# Patient Record
Sex: Female | Born: 1950 | Race: White | Hispanic: No
Health system: Southern US, Community
[De-identification: ages and names within clinical notes are randomized; demographics above are authoritative.]

---

## 2016-11-01 DIAGNOSIS — Z79899 Other long term (current) drug therapy: Secondary | ICD-10-CM | POA: Diagnosis not present

## 2016-11-01 DIAGNOSIS — I10 Essential (primary) hypertension: Secondary | ICD-10-CM | POA: Diagnosis not present

## 2016-11-01 DIAGNOSIS — M4722 Other spondylosis with radiculopathy, cervical region: Secondary | ICD-10-CM | POA: Diagnosis not present

## 2016-11-01 DIAGNOSIS — K219 Gastro-esophageal reflux disease without esophagitis: Secondary | ICD-10-CM | POA: Diagnosis not present

## 2016-11-01 DIAGNOSIS — Z720 Tobacco use: Secondary | ICD-10-CM | POA: Diagnosis not present

## 2016-11-16 DIAGNOSIS — M4712 Other spondylosis with myelopathy, cervical region: Secondary | ICD-10-CM | POA: Diagnosis not present

## 2016-11-16 DIAGNOSIS — Z681 Body mass index (BMI) 19 or less, adult: Secondary | ICD-10-CM | POA: Diagnosis not present

## 2016-11-16 DIAGNOSIS — M542 Cervicalgia: Secondary | ICD-10-CM | POA: Diagnosis not present

## 2016-11-16 DIAGNOSIS — I10 Essential (primary) hypertension: Secondary | ICD-10-CM | POA: Diagnosis not present

## 2016-12-04 DIAGNOSIS — Z1231 Encounter for screening mammogram for malignant neoplasm of breast: Secondary | ICD-10-CM | POA: Diagnosis not present

## 2016-12-04 DIAGNOSIS — Z9189 Other specified personal risk factors, not elsewhere classified: Secondary | ICD-10-CM | POA: Diagnosis not present

## 2016-12-04 DIAGNOSIS — Z720 Tobacco use: Secondary | ICD-10-CM | POA: Diagnosis not present

## 2016-12-04 DIAGNOSIS — I10 Essential (primary) hypertension: Secondary | ICD-10-CM | POA: Diagnosis not present

## 2016-12-04 DIAGNOSIS — K219 Gastro-esophageal reflux disease without esophagitis: Secondary | ICD-10-CM | POA: Diagnosis not present

## 2016-12-04 DIAGNOSIS — Z5320 Procedure and treatment not carried out because of patient's decision for unspecified reasons: Secondary | ICD-10-CM | POA: Diagnosis not present

## 2016-12-04 DIAGNOSIS — M4722 Other spondylosis with radiculopathy, cervical region: Secondary | ICD-10-CM | POA: Diagnosis not present

## 2016-12-07 DIAGNOSIS — M4802 Spinal stenosis, cervical region: Secondary | ICD-10-CM | POA: Diagnosis not present

## 2016-12-07 DIAGNOSIS — M542 Cervicalgia: Secondary | ICD-10-CM | POA: Diagnosis not present

## 2016-12-20 DIAGNOSIS — I10 Essential (primary) hypertension: Secondary | ICD-10-CM | POA: Diagnosis not present

## 2016-12-20 DIAGNOSIS — M4712 Other spondylosis with myelopathy, cervical region: Secondary | ICD-10-CM | POA: Diagnosis not present

## 2016-12-20 DIAGNOSIS — Z681 Body mass index (BMI) 19 or less, adult: Secondary | ICD-10-CM | POA: Diagnosis not present

## 2016-12-25 ENCOUNTER — Other Ambulatory Visit: Payer: Self-pay | Admitting: Neurosurgery

## 2016-12-25 DIAGNOSIS — M4712 Other spondylosis with myelopathy, cervical region: Secondary | ICD-10-CM

## 2016-12-27 ENCOUNTER — Ambulatory Visit
Admission: RE | Admit: 2016-12-27 | Discharge: 2016-12-27 | Disposition: A | Discharge status: Home / Self Care | Payer: PPO | Source: Ambulatory Visit | Attending: Neurosurgery | Admitting: Neurosurgery

## 2016-12-27 DIAGNOSIS — M4712 Other spondylosis with myelopathy, cervical region: Secondary | ICD-10-CM

## 2016-12-27 DIAGNOSIS — M4802 Spinal stenosis, cervical region: Secondary | ICD-10-CM | POA: Diagnosis not present

## 2016-12-28 DIAGNOSIS — M4012 Other secondary kyphosis, cervical region: Secondary | ICD-10-CM | POA: Diagnosis not present

## 2016-12-28 DIAGNOSIS — M4712 Other spondylosis with myelopathy, cervical region: Secondary | ICD-10-CM | POA: Diagnosis not present

## 2016-12-29 ENCOUNTER — Other Ambulatory Visit: Payer: Self-pay | Admitting: Neurosurgery

## 2017-03-19 ENCOUNTER — Inpatient Hospital Stay (HOSPITAL_COMMUNITY): Admission: RE | Admit: 2017-03-19 | Payer: PPO | Source: Ambulatory Visit

## 2017-03-26 DIAGNOSIS — Z681 Body mass index (BMI) 19 or less, adult: Secondary | ICD-10-CM | POA: Diagnosis not present

## 2017-03-26 DIAGNOSIS — I10 Essential (primary) hypertension: Secondary | ICD-10-CM | POA: Diagnosis not present

## 2017-03-26 DIAGNOSIS — Z720 Tobacco use: Secondary | ICD-10-CM | POA: Diagnosis not present

## 2017-03-26 DIAGNOSIS — F4323 Adjustment disorder with mixed anxiety and depressed mood: Secondary | ICD-10-CM | POA: Diagnosis not present

## 2017-03-26 DIAGNOSIS — Z79899 Other long term (current) drug therapy: Secondary | ICD-10-CM | POA: Diagnosis not present

## 2017-03-26 DIAGNOSIS — Z1159 Encounter for screening for other viral diseases: Secondary | ICD-10-CM | POA: Diagnosis not present

## 2017-03-26 DIAGNOSIS — Z5320 Procedure and treatment not carried out because of patient's decision for unspecified reasons: Secondary | ICD-10-CM | POA: Diagnosis not present

## 2017-03-26 DIAGNOSIS — M4722 Other spondylosis with radiculopathy, cervical region: Secondary | ICD-10-CM | POA: Diagnosis not present

## 2017-03-26 DIAGNOSIS — Z0000 Encounter for general adult medical examination without abnormal findings: Secondary | ICD-10-CM | POA: Diagnosis not present

## 2017-03-26 DIAGNOSIS — K219 Gastro-esophageal reflux disease without esophagitis: Secondary | ICD-10-CM | POA: Diagnosis not present

## 2017-04-23 ENCOUNTER — Inpatient Hospital Stay (HOSPITAL_COMMUNITY): Admission: RE | Admit: 2017-04-23 | Payer: PPO | Source: Ambulatory Visit

## 2017-05-01 ENCOUNTER — Encounter (HOSPITAL_COMMUNITY): Admission: RE | Payer: Self-pay | Source: Ambulatory Visit

## 2017-05-01 ENCOUNTER — Inpatient Hospital Stay (HOSPITAL_COMMUNITY): Admission: RE | Admit: 2017-05-01 | Payer: PPO | Source: Ambulatory Visit | Admitting: Neurosurgery

## 2017-05-01 SURGERY — POSTERIOR CERVICAL FUSION/FORAMINOTOMY LEVEL 3
Anesthesia: General

## 2017-05-04 ENCOUNTER — Inpatient Hospital Stay (HOSPITAL_COMMUNITY): Admission: RE | Admit: 2017-05-04 | Payer: PPO | Source: Ambulatory Visit | Admitting: Neurosurgery

## 2017-05-04 ENCOUNTER — Encounter (HOSPITAL_COMMUNITY): Admission: RE | Payer: Self-pay | Source: Ambulatory Visit

## 2017-05-04 SURGERY — ANTERIOR AND POSTERIOR SPINAL FUSION
Anesthesia: General

## 2017-10-17 DIAGNOSIS — J019 Acute sinusitis, unspecified: Secondary | ICD-10-CM | POA: Diagnosis not present

## 2017-10-17 DIAGNOSIS — B9689 Other specified bacterial agents as the cause of diseases classified elsewhere: Secondary | ICD-10-CM | POA: Diagnosis not present

## 2017-10-17 DIAGNOSIS — Z720 Tobacco use: Secondary | ICD-10-CM | POA: Diagnosis not present

## 2017-10-17 DIAGNOSIS — Z681 Body mass index (BMI) 19 or less, adult: Secondary | ICD-10-CM | POA: Diagnosis not present

## 2017-10-17 DIAGNOSIS — J069 Acute upper respiratory infection, unspecified: Secondary | ICD-10-CM | POA: Diagnosis not present

## 2017-11-27 DIAGNOSIS — E559 Vitamin D deficiency, unspecified: Secondary | ICD-10-CM | POA: Diagnosis not present

## 2017-11-27 DIAGNOSIS — M4722 Other spondylosis with radiculopathy, cervical region: Secondary | ICD-10-CM | POA: Diagnosis not present

## 2017-11-27 DIAGNOSIS — K219 Gastro-esophageal reflux disease without esophagitis: Secondary | ICD-10-CM | POA: Diagnosis not present

## 2017-11-27 DIAGNOSIS — Z79899 Other long term (current) drug therapy: Secondary | ICD-10-CM | POA: Diagnosis not present

## 2017-11-27 DIAGNOSIS — Z6820 Body mass index (BMI) 20.0-20.9, adult: Secondary | ICD-10-CM | POA: Diagnosis not present

## 2017-11-27 DIAGNOSIS — I10 Essential (primary) hypertension: Secondary | ICD-10-CM | POA: Diagnosis not present

## 2017-11-27 DIAGNOSIS — F17210 Nicotine dependence, cigarettes, uncomplicated: Secondary | ICD-10-CM | POA: Diagnosis not present

## 2018-01-25 IMAGING — CT CT CERVICAL SPINE W/O CM
3 of 7 series · 13 of 34 positions shown, 15 images · non-contrast
Comparison: Cervical spine radiographs at ordering provider office
11/16/2016.

CLINICAL DATA: Neck pain and popping. Osteoarthritis. No history of
previous surgery.

EXAM:
CT CERVICAL SPINE WITHOUT CONTRAST
TECHNIQUE: Multidetector CT imaging of the cervical spine was performed without
intravenous contrast. Multiplanar CT image reconstructions were also
generated.

[Series 200: cor · coronal · 0.39mm/px · 1 of 57 slices shown]
[im 29/57  bone]
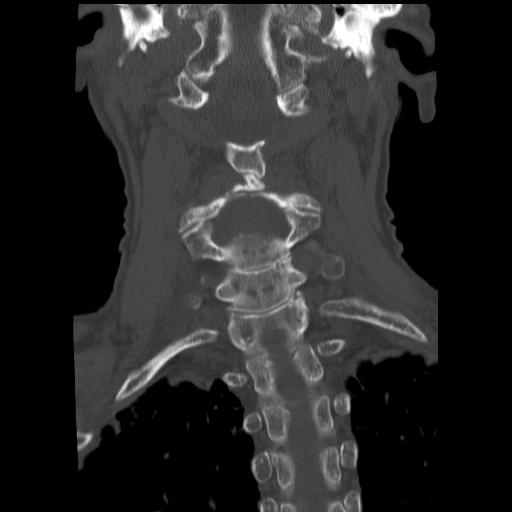

[Series 202: sag · sagittal · 0.39mm/px · 5 of 57 slices shown]
[im 10/57  bone]
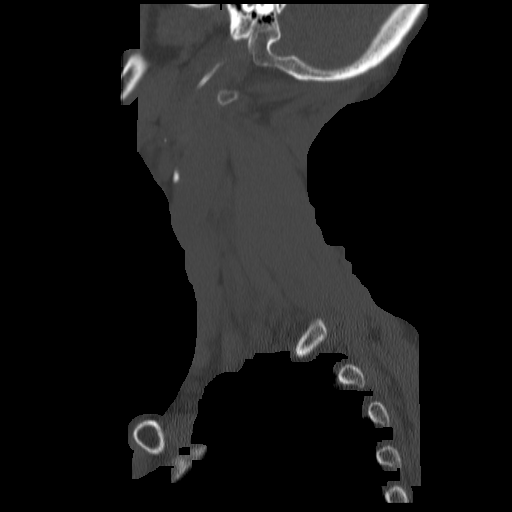
[im 19/57  bone]
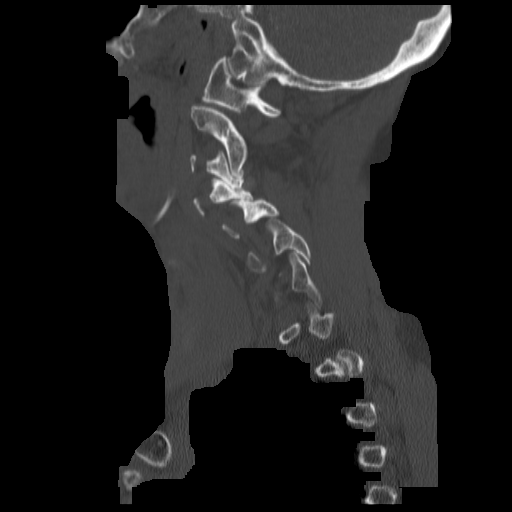
[im 29/57  bone]
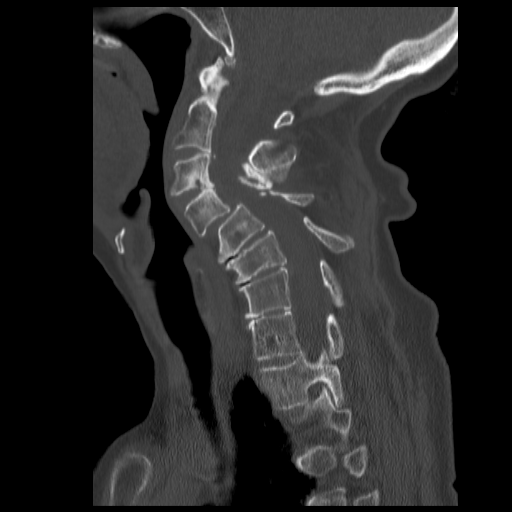
[im 38/57  bone]
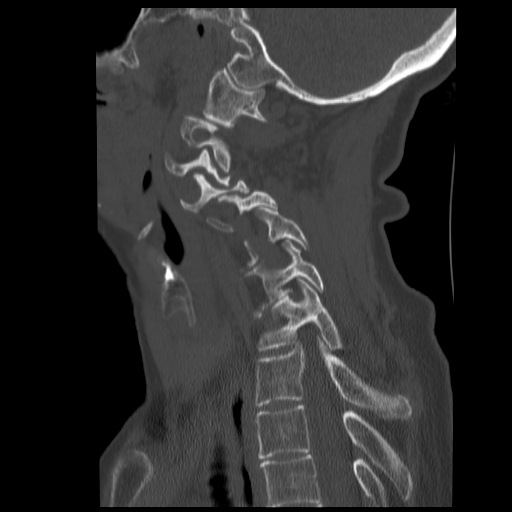
[im 47/57  bone]
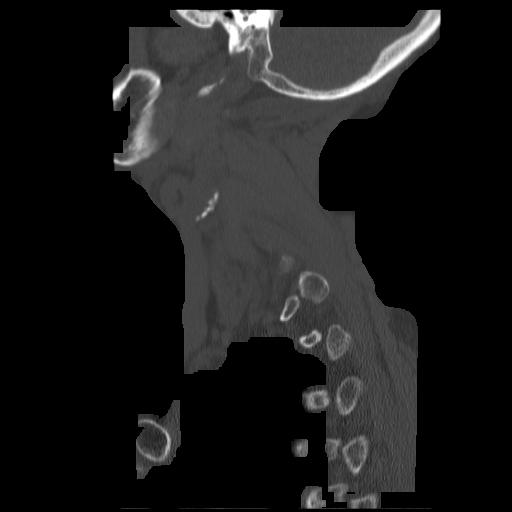

[Series 204: angled axial · axial · 0.20mm/px · z∈[-43,+107]mm · 7 of 287 slices shown, 9 images]
[im 36/287  soft-tissue]
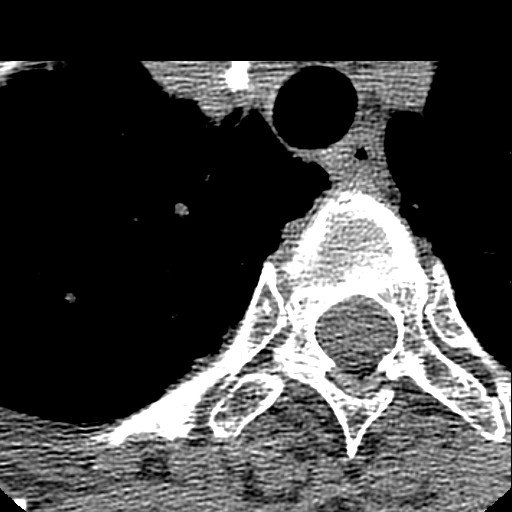
[im 36/287  bone]
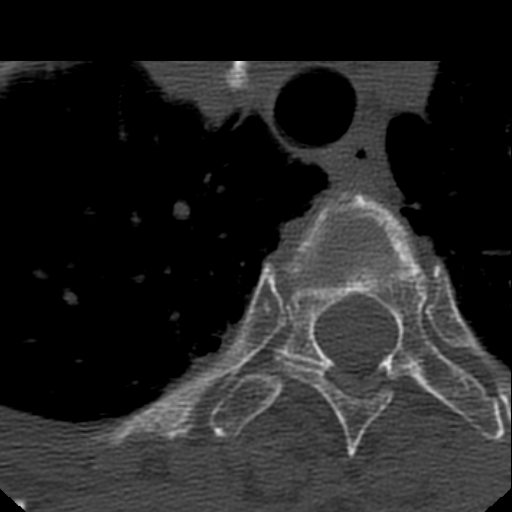
[im 72/287  bone]
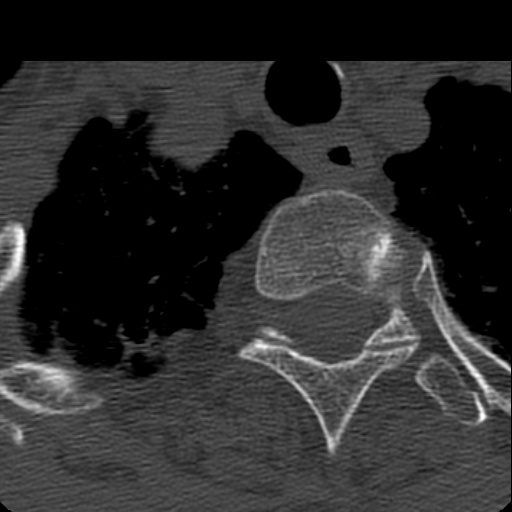
[im 108/287  bone]
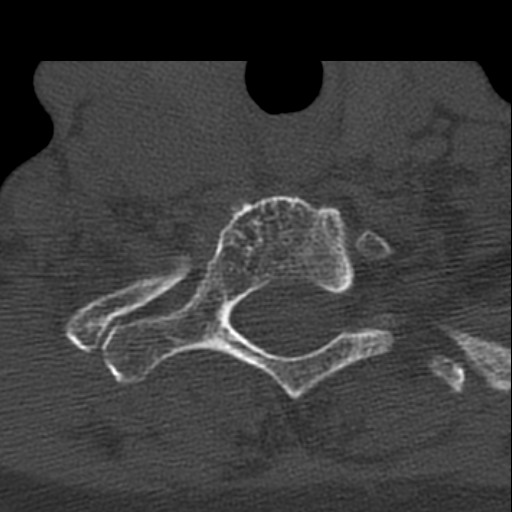
[im 144/287  bone]
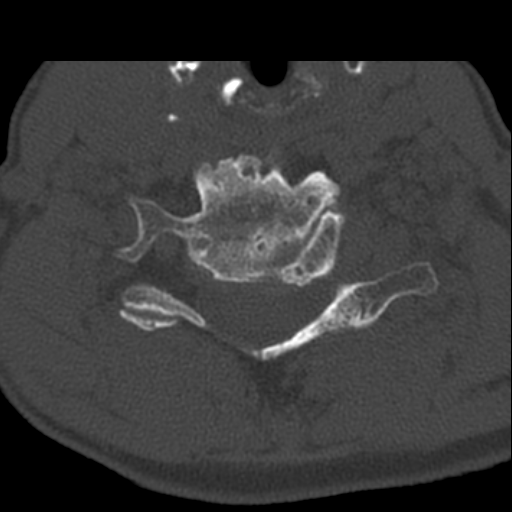
[im 179/287  soft-tissue]
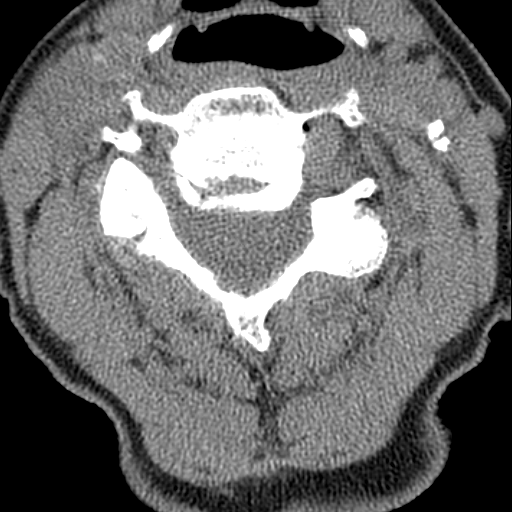
[im 179/287  bone]
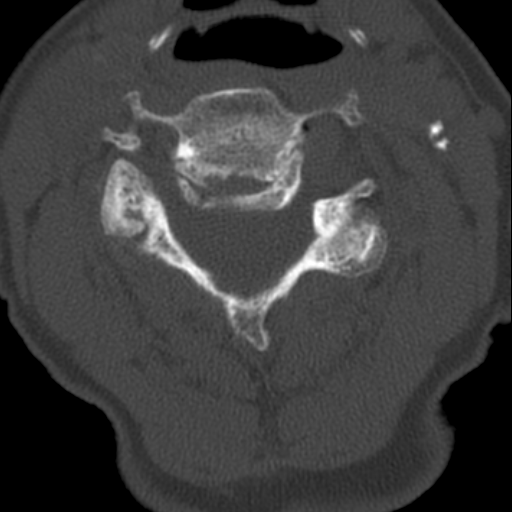
[im 215/287  bone]
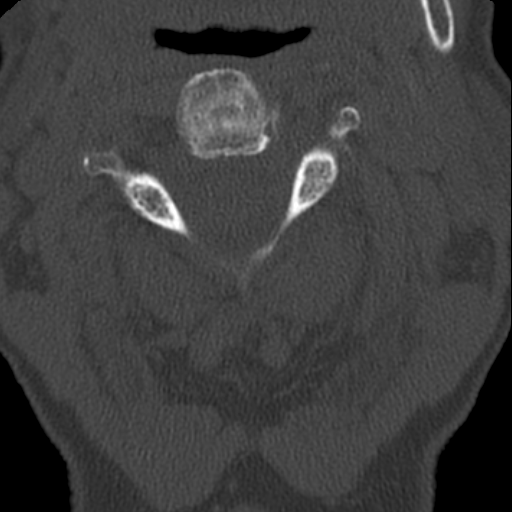
[im 251/287  bone]
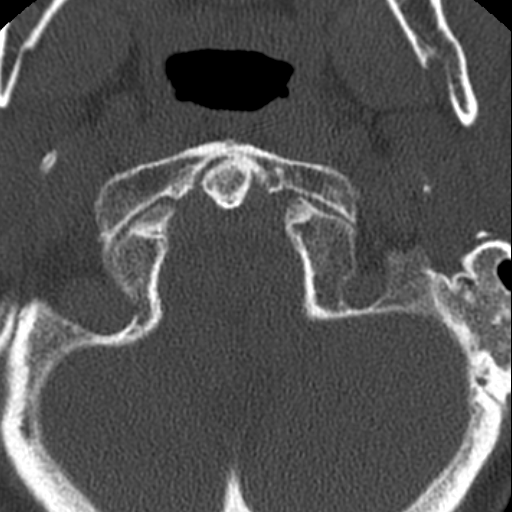

[13 of 34 positions shown; findings below may reference images not displayed]

FINDINGS: Alignment: 5 mm anterolisthesis C4 on C5. Yolanda deformity caudal
to this level, with disc space narrowing at C5-6, C6-7, and C7-T1.

Skull base and vertebrae: Incidental hemangioma in T1. Endplate
sclerotic change above and below C3-C4. Advanced facet arthropathy.

Soft tissues and spinal canal: No prevertebral fluid or swelling. No
visible canal hematoma. Atherosclerosis.

Disc levels:

C2-3: LEFT greater than RIGHT facet arthropathy. Slight LEFT-sided
uncinate spurring without impingement.

C3-4: Severe LEFT-sided facet arthropathy. LEFT greater than RIGHT
uncinate spurring contributes to LEFT C4 foraminal narrowing.

C4-5: 5 mm of facet mediated slip. Mild canal stenosis is suspected.
LEFT C5 foraminal narrowing is likely.

C5-6:  Advanced disc space narrowing.  No definite impingement.

C6-7: Advanced disc space narrowing. Central protrusion. LEFT-sided
uncinate spurring. LEFT C7 foraminal narrowing.

C7-T1: Advanced disc space narrowing. Facet arthropathy. LEFT-sided
uncinate spurring. LEFT C8 foraminal narrowing.

Upper chest: Fibrotic change at the lung apices. Scattered
emphysematous blebs.

Other: None.
IMPRESSION: Advanced cervical spondylosis. Potentially symptomatic neural
impingement at C3-4, C4-5, C6-7, and/or C7-T1. See discussion above.

5 mm slip at C4-5 contributes to mild stenosis. This is facet
mediated. Severe facet arthropathy is also present on the LEFT at
C3-C4.

## 2018-09-07 DIAGNOSIS — M4722 Other spondylosis with radiculopathy, cervical region: Secondary | ICD-10-CM | POA: Diagnosis not present

## 2018-09-07 DIAGNOSIS — K219 Gastro-esophageal reflux disease without esophagitis: Secondary | ICD-10-CM | POA: Diagnosis not present

## 2018-09-07 DIAGNOSIS — I10 Essential (primary) hypertension: Secondary | ICD-10-CM | POA: Diagnosis not present

## 2018-09-12 DIAGNOSIS — Z79899 Other long term (current) drug therapy: Secondary | ICD-10-CM | POA: Diagnosis not present

## 2018-09-12 DIAGNOSIS — E559 Vitamin D deficiency, unspecified: Secondary | ICD-10-CM | POA: Diagnosis not present

## 2018-10-03 DIAGNOSIS — M545 Low back pain: Secondary | ICD-10-CM | POA: Diagnosis not present

## 2018-10-03 DIAGNOSIS — J209 Acute bronchitis, unspecified: Secondary | ICD-10-CM | POA: Diagnosis not present

## 2018-10-03 DIAGNOSIS — B9789 Other viral agents as the cause of diseases classified elsewhere: Secondary | ICD-10-CM | POA: Diagnosis not present

## 2018-10-03 DIAGNOSIS — J069 Acute upper respiratory infection, unspecified: Secondary | ICD-10-CM | POA: Diagnosis not present

## 2018-10-03 DIAGNOSIS — Z6820 Body mass index (BMI) 20.0-20.9, adult: Secondary | ICD-10-CM | POA: Diagnosis not present

## 2018-10-03 DIAGNOSIS — I10 Essential (primary) hypertension: Secondary | ICD-10-CM | POA: Diagnosis not present

## 2018-10-08 DIAGNOSIS — E559 Vitamin D deficiency, unspecified: Secondary | ICD-10-CM | POA: Diagnosis not present

## 2018-10-08 DIAGNOSIS — M4722 Other spondylosis with radiculopathy, cervical region: Secondary | ICD-10-CM | POA: Diagnosis not present

## 2018-10-08 DIAGNOSIS — K219 Gastro-esophageal reflux disease without esophagitis: Secondary | ICD-10-CM | POA: Diagnosis not present

## 2018-10-17 DIAGNOSIS — K219 Gastro-esophageal reflux disease without esophagitis: Secondary | ICD-10-CM | POA: Diagnosis not present

## 2018-10-17 DIAGNOSIS — Z9181 History of falling: Secondary | ICD-10-CM | POA: Diagnosis not present

## 2018-10-17 DIAGNOSIS — Z1331 Encounter for screening for depression: Secondary | ICD-10-CM | POA: Diagnosis not present

## 2018-10-17 DIAGNOSIS — Z6820 Body mass index (BMI) 20.0-20.9, adult: Secondary | ICD-10-CM | POA: Diagnosis not present

## 2018-10-17 DIAGNOSIS — M4722 Other spondylosis with radiculopathy, cervical region: Secondary | ICD-10-CM | POA: Diagnosis not present

## 2018-10-17 DIAGNOSIS — I10 Essential (primary) hypertension: Secondary | ICD-10-CM | POA: Diagnosis not present

## 2018-10-17 DIAGNOSIS — F17210 Nicotine dependence, cigarettes, uncomplicated: Secondary | ICD-10-CM | POA: Diagnosis not present

## 2018-11-07 DIAGNOSIS — I10 Essential (primary) hypertension: Secondary | ICD-10-CM | POA: Diagnosis not present

## 2018-11-07 DIAGNOSIS — E559 Vitamin D deficiency, unspecified: Secondary | ICD-10-CM | POA: Diagnosis not present

## 2018-11-07 DIAGNOSIS — K219 Gastro-esophageal reflux disease without esophagitis: Secondary | ICD-10-CM | POA: Diagnosis not present

## 2018-11-13 DIAGNOSIS — E559 Vitamin D deficiency, unspecified: Secondary | ICD-10-CM | POA: Diagnosis not present

## 2018-11-13 DIAGNOSIS — Z79899 Other long term (current) drug therapy: Secondary | ICD-10-CM | POA: Diagnosis not present

## 2018-11-13 DIAGNOSIS — K219 Gastro-esophageal reflux disease without esophagitis: Secondary | ICD-10-CM | POA: Diagnosis not present

## 2018-12-06 DIAGNOSIS — E559 Vitamin D deficiency, unspecified: Secondary | ICD-10-CM | POA: Diagnosis not present

## 2018-12-06 DIAGNOSIS — I10 Essential (primary) hypertension: Secondary | ICD-10-CM | POA: Diagnosis not present

## 2018-12-06 DIAGNOSIS — K219 Gastro-esophageal reflux disease without esophagitis: Secondary | ICD-10-CM | POA: Diagnosis not present

## 2019-01-07 DIAGNOSIS — K219 Gastro-esophageal reflux disease without esophagitis: Secondary | ICD-10-CM | POA: Diagnosis not present

## 2019-01-07 DIAGNOSIS — I10 Essential (primary) hypertension: Secondary | ICD-10-CM | POA: Diagnosis not present

## 2019-04-08 DIAGNOSIS — I10 Essential (primary) hypertension: Secondary | ICD-10-CM | POA: Diagnosis not present

## 2019-04-08 DIAGNOSIS — K219 Gastro-esophageal reflux disease without esophagitis: Secondary | ICD-10-CM | POA: Diagnosis not present

## 2019-05-09 DIAGNOSIS — K219 Gastro-esophageal reflux disease without esophagitis: Secondary | ICD-10-CM | POA: Diagnosis not present

## 2019-05-09 DIAGNOSIS — I10 Essential (primary) hypertension: Secondary | ICD-10-CM | POA: Diagnosis not present

## 2019-06-09 DIAGNOSIS — K219 Gastro-esophageal reflux disease without esophagitis: Secondary | ICD-10-CM | POA: Diagnosis not present

## 2019-06-09 DIAGNOSIS — I10 Essential (primary) hypertension: Secondary | ICD-10-CM | POA: Diagnosis not present

## 2019-06-12 DIAGNOSIS — F17210 Nicotine dependence, cigarettes, uncomplicated: Secondary | ICD-10-CM | POA: Diagnosis not present

## 2019-06-12 DIAGNOSIS — Z0000 Encounter for general adult medical examination without abnormal findings: Secondary | ICD-10-CM | POA: Diagnosis not present

## 2019-06-12 DIAGNOSIS — I10 Essential (primary) hypertension: Secondary | ICD-10-CM | POA: Diagnosis not present

## 2019-06-12 DIAGNOSIS — K219 Gastro-esophageal reflux disease without esophagitis: Secondary | ICD-10-CM | POA: Diagnosis not present

## 2019-06-12 DIAGNOSIS — Z6820 Body mass index (BMI) 20.0-20.9, adult: Secondary | ICD-10-CM | POA: Diagnosis not present

## 2019-06-12 DIAGNOSIS — M4722 Other spondylosis with radiculopathy, cervical region: Secondary | ICD-10-CM | POA: Diagnosis not present

## 2019-07-09 DIAGNOSIS — I10 Essential (primary) hypertension: Secondary | ICD-10-CM | POA: Diagnosis not present

## 2019-07-09 DIAGNOSIS — K219 Gastro-esophageal reflux disease without esophagitis: Secondary | ICD-10-CM | POA: Diagnosis not present

## 2019-08-08 DIAGNOSIS — K219 Gastro-esophageal reflux disease without esophagitis: Secondary | ICD-10-CM | POA: Diagnosis not present

## 2019-08-08 DIAGNOSIS — I10 Essential (primary) hypertension: Secondary | ICD-10-CM | POA: Diagnosis not present

## 2019-10-09 DIAGNOSIS — K219 Gastro-esophageal reflux disease without esophagitis: Secondary | ICD-10-CM | POA: Diagnosis not present

## 2019-10-09 DIAGNOSIS — I10 Essential (primary) hypertension: Secondary | ICD-10-CM | POA: Diagnosis not present

## 2019-12-07 DIAGNOSIS — I10 Essential (primary) hypertension: Secondary | ICD-10-CM | POA: Diagnosis not present

## 2019-12-07 DIAGNOSIS — K219 Gastro-esophageal reflux disease without esophagitis: Secondary | ICD-10-CM | POA: Diagnosis not present

## 2020-02-02 DIAGNOSIS — M4712 Other spondylosis with myelopathy, cervical region: Secondary | ICD-10-CM | POA: Diagnosis not present

## 2020-02-11 DIAGNOSIS — M5126 Other intervertebral disc displacement, lumbar region: Secondary | ICD-10-CM | POA: Diagnosis not present

## 2020-02-11 DIAGNOSIS — M545 Low back pain: Secondary | ICD-10-CM | POA: Diagnosis not present

## 2020-02-11 DIAGNOSIS — M4712 Other spondylosis with myelopathy, cervical region: Secondary | ICD-10-CM | POA: Diagnosis not present

## 2020-02-11 DIAGNOSIS — M4802 Spinal stenosis, cervical region: Secondary | ICD-10-CM | POA: Diagnosis not present

## 2020-02-11 DIAGNOSIS — M48061 Spinal stenosis, lumbar region without neurogenic claudication: Secondary | ICD-10-CM | POA: Diagnosis not present

## 2020-02-11 DIAGNOSIS — M47816 Spondylosis without myelopathy or radiculopathy, lumbar region: Secondary | ICD-10-CM | POA: Diagnosis not present

## 2020-02-11 DIAGNOSIS — M50223 Other cervical disc displacement at C6-C7 level: Secondary | ICD-10-CM | POA: Diagnosis not present

## 2020-02-16 DIAGNOSIS — Z6820 Body mass index (BMI) 20.0-20.9, adult: Secondary | ICD-10-CM | POA: Diagnosis not present

## 2020-02-16 DIAGNOSIS — H6123 Impacted cerumen, bilateral: Secondary | ICD-10-CM | POA: Diagnosis not present

## 2020-02-16 DIAGNOSIS — J309 Allergic rhinitis, unspecified: Secondary | ICD-10-CM | POA: Diagnosis not present

## 2020-02-17 DIAGNOSIS — H61303 Acquired stenosis of external ear canal, unspecified, bilateral: Secondary | ICD-10-CM | POA: Diagnosis not present

## 2020-02-17 DIAGNOSIS — H6123 Impacted cerumen, bilateral: Secondary | ICD-10-CM | POA: Diagnosis not present

## 2020-02-17 DIAGNOSIS — T7840XA Allergy, unspecified, initial encounter: Secondary | ICD-10-CM | POA: Diagnosis not present

## 2020-02-17 DIAGNOSIS — H9190 Unspecified hearing loss, unspecified ear: Secondary | ICD-10-CM | POA: Diagnosis not present

## 2020-02-19 ENCOUNTER — Other Ambulatory Visit: Payer: Self-pay | Admitting: Physician Assistant

## 2020-02-19 DIAGNOSIS — M4712 Other spondylosis with myelopathy, cervical region: Secondary | ICD-10-CM

## 2020-02-23 ENCOUNTER — Ambulatory Visit
Admission: RE | Admit: 2020-02-23 | Discharge: 2020-02-23 | Disposition: A | Discharge status: Home / Self Care | Payer: PPO | Source: Ambulatory Visit | Attending: Physician Assistant | Admitting: Physician Assistant

## 2020-02-23 DIAGNOSIS — M4712 Other spondylosis with myelopathy, cervical region: Secondary | ICD-10-CM

## 2020-02-23 DIAGNOSIS — M4802 Spinal stenosis, cervical region: Secondary | ICD-10-CM | POA: Diagnosis not present

## 2020-03-08 DIAGNOSIS — I10 Essential (primary) hypertension: Secondary | ICD-10-CM | POA: Diagnosis not present

## 2020-03-08 DIAGNOSIS — K219 Gastro-esophageal reflux disease without esophagitis: Secondary | ICD-10-CM | POA: Diagnosis not present

## 2020-03-10 DIAGNOSIS — M4712 Other spondylosis with myelopathy, cervical region: Secondary | ICD-10-CM | POA: Diagnosis not present

## 2020-03-10 DIAGNOSIS — M4012 Other secondary kyphosis, cervical region: Secondary | ICD-10-CM | POA: Diagnosis not present

## 2020-03-10 DIAGNOSIS — I10 Essential (primary) hypertension: Secondary | ICD-10-CM | POA: Diagnosis not present

## 2020-03-10 DIAGNOSIS — M542 Cervicalgia: Secondary | ICD-10-CM | POA: Diagnosis not present

## 2020-03-17 DIAGNOSIS — I10 Essential (primary) hypertension: Secondary | ICD-10-CM | POA: Diagnosis not present

## 2020-03-17 DIAGNOSIS — M4712 Other spondylosis with myelopathy, cervical region: Secondary | ICD-10-CM | POA: Diagnosis not present

## 2020-03-17 DIAGNOSIS — M4012 Other secondary kyphosis, cervical region: Secondary | ICD-10-CM | POA: Diagnosis not present

## 2020-03-26 DIAGNOSIS — K219 Gastro-esophageal reflux disease without esophagitis: Secondary | ICD-10-CM | POA: Diagnosis not present

## 2020-03-26 DIAGNOSIS — Z6820 Body mass index (BMI) 20.0-20.9, adult: Secondary | ICD-10-CM | POA: Diagnosis not present

## 2020-03-26 DIAGNOSIS — F17210 Nicotine dependence, cigarettes, uncomplicated: Secondary | ICD-10-CM | POA: Diagnosis not present

## 2020-03-26 DIAGNOSIS — I10 Essential (primary) hypertension: Secondary | ICD-10-CM | POA: Diagnosis not present

## 2020-03-26 DIAGNOSIS — Z9181 History of falling: Secondary | ICD-10-CM | POA: Diagnosis not present

## 2020-03-26 DIAGNOSIS — M4722 Other spondylosis with radiculopathy, cervical region: Secondary | ICD-10-CM | POA: Diagnosis not present

## 2020-03-26 DIAGNOSIS — R0989 Other specified symptoms and signs involving the circulatory and respiratory systems: Secondary | ICD-10-CM | POA: Diagnosis not present

## 2020-03-26 DIAGNOSIS — Z1331 Encounter for screening for depression: Secondary | ICD-10-CM | POA: Diagnosis not present

## 2020-05-05 DIAGNOSIS — M438X2 Other specified deforming dorsopathies, cervical region: Secondary | ICD-10-CM | POA: Diagnosis not present

## 2020-05-05 DIAGNOSIS — M4312 Spondylolisthesis, cervical region: Secondary | ICD-10-CM | POA: Diagnosis not present

## 2020-05-05 DIAGNOSIS — M4712 Other spondylosis with myelopathy, cervical region: Secondary | ICD-10-CM | POA: Diagnosis not present

## 2020-05-05 DIAGNOSIS — M4802 Spinal stenosis, cervical region: Secondary | ICD-10-CM | POA: Diagnosis not present

## 2020-05-05 DIAGNOSIS — M810 Age-related osteoporosis without current pathological fracture: Secondary | ICD-10-CM | POA: Diagnosis not present

## 2020-05-05 DIAGNOSIS — F17210 Nicotine dependence, cigarettes, uncomplicated: Secondary | ICD-10-CM | POA: Diagnosis not present

## 2020-05-05 DIAGNOSIS — M5441 Lumbago with sciatica, right side: Secondary | ICD-10-CM | POA: Diagnosis not present

## 2020-05-05 DIAGNOSIS — M953 Acquired deformity of neck: Secondary | ICD-10-CM | POA: Diagnosis not present

## 2020-05-05 DIAGNOSIS — M5442 Lumbago with sciatica, left side: Secondary | ICD-10-CM | POA: Diagnosis not present

## 2020-05-05 DIAGNOSIS — G9529 Other cord compression: Secondary | ICD-10-CM | POA: Diagnosis not present

## 2020-05-05 DIAGNOSIS — M4012 Other secondary kyphosis, cervical region: Secondary | ICD-10-CM | POA: Diagnosis not present

## 2020-05-05 DIAGNOSIS — M4125 Other idiopathic scoliosis, thoracolumbar region: Secondary | ICD-10-CM | POA: Diagnosis not present

## 2020-05-05 DIAGNOSIS — M47812 Spondylosis without myelopathy or radiculopathy, cervical region: Secondary | ICD-10-CM | POA: Diagnosis not present

## 2020-05-08 DIAGNOSIS — I10 Essential (primary) hypertension: Secondary | ICD-10-CM | POA: Diagnosis not present

## 2020-05-08 DIAGNOSIS — K219 Gastro-esophageal reflux disease without esophagitis: Secondary | ICD-10-CM | POA: Diagnosis not present

## 2020-06-03 DIAGNOSIS — G8929 Other chronic pain: Secondary | ICD-10-CM | POA: Diagnosis not present

## 2020-06-03 DIAGNOSIS — M7918 Myalgia, other site: Secondary | ICD-10-CM | POA: Diagnosis not present

## 2020-06-03 DIAGNOSIS — M47812 Spondylosis without myelopathy or radiculopathy, cervical region: Secondary | ICD-10-CM | POA: Diagnosis not present

## 2020-06-03 DIAGNOSIS — M47816 Spondylosis without myelopathy or radiculopathy, lumbar region: Secondary | ICD-10-CM | POA: Diagnosis not present

## 2020-06-03 DIAGNOSIS — M438X2 Other specified deforming dorsopathies, cervical region: Secondary | ICD-10-CM | POA: Diagnosis not present

## 2020-06-03 DIAGNOSIS — M5442 Lumbago with sciatica, left side: Secondary | ICD-10-CM | POA: Diagnosis not present

## 2020-06-08 DIAGNOSIS — K219 Gastro-esophageal reflux disease without esophagitis: Secondary | ICD-10-CM | POA: Diagnosis not present

## 2020-06-08 DIAGNOSIS — I10 Essential (primary) hypertension: Secondary | ICD-10-CM | POA: Diagnosis not present

## 2020-06-24 DIAGNOSIS — N3000 Acute cystitis without hematuria: Secondary | ICD-10-CM | POA: Diagnosis not present

## 2020-06-24 DIAGNOSIS — Z6820 Body mass index (BMI) 20.0-20.9, adult: Secondary | ICD-10-CM | POA: Diagnosis not present

## 2020-06-24 DIAGNOSIS — I10 Essential (primary) hypertension: Secondary | ICD-10-CM | POA: Diagnosis not present

## 2020-07-08 DIAGNOSIS — M47812 Spondylosis without myelopathy or radiculopathy, cervical region: Secondary | ICD-10-CM | POA: Diagnosis not present

## 2020-07-08 DIAGNOSIS — M542 Cervicalgia: Secondary | ICD-10-CM | POA: Diagnosis not present

## 2020-07-08 DIAGNOSIS — M545 Low back pain: Secondary | ICD-10-CM | POA: Diagnosis not present

## 2020-07-08 DIAGNOSIS — M5442 Lumbago with sciatica, left side: Secondary | ICD-10-CM | POA: Diagnosis not present

## 2020-07-08 DIAGNOSIS — G8929 Other chronic pain: Secondary | ICD-10-CM | POA: Diagnosis not present

## 2020-07-08 DIAGNOSIS — M5441 Lumbago with sciatica, right side: Secondary | ICD-10-CM | POA: Diagnosis not present

## 2020-07-08 DIAGNOSIS — M438X2 Other specified deforming dorsopathies, cervical region: Secondary | ICD-10-CM | POA: Diagnosis not present

## 2020-07-13 DIAGNOSIS — G8929 Other chronic pain: Secondary | ICD-10-CM | POA: Diagnosis not present

## 2020-07-13 DIAGNOSIS — M542 Cervicalgia: Secondary | ICD-10-CM | POA: Diagnosis not present

## 2020-07-13 DIAGNOSIS — M545 Low back pain, unspecified: Secondary | ICD-10-CM | POA: Diagnosis not present

## 2020-07-13 DIAGNOSIS — M47812 Spondylosis without myelopathy or radiculopathy, cervical region: Secondary | ICD-10-CM | POA: Diagnosis not present

## 2020-07-13 DIAGNOSIS — M5442 Lumbago with sciatica, left side: Secondary | ICD-10-CM | POA: Diagnosis not present

## 2020-07-13 DIAGNOSIS — M438X2 Other specified deforming dorsopathies, cervical region: Secondary | ICD-10-CM | POA: Diagnosis not present

## 2020-07-13 DIAGNOSIS — M5441 Lumbago with sciatica, right side: Secondary | ICD-10-CM | POA: Diagnosis not present

## 2020-07-15 DIAGNOSIS — G8929 Other chronic pain: Secondary | ICD-10-CM | POA: Diagnosis not present

## 2020-07-15 DIAGNOSIS — M545 Low back pain, unspecified: Secondary | ICD-10-CM | POA: Diagnosis not present

## 2020-07-15 DIAGNOSIS — M438X2 Other specified deforming dorsopathies, cervical region: Secondary | ICD-10-CM | POA: Diagnosis not present

## 2020-07-15 DIAGNOSIS — M5442 Lumbago with sciatica, left side: Secondary | ICD-10-CM | POA: Diagnosis not present

## 2020-07-15 DIAGNOSIS — M542 Cervicalgia: Secondary | ICD-10-CM | POA: Diagnosis not present

## 2020-07-15 DIAGNOSIS — M47812 Spondylosis without myelopathy or radiculopathy, cervical region: Secondary | ICD-10-CM | POA: Diagnosis not present

## 2020-07-15 DIAGNOSIS — M5441 Lumbago with sciatica, right side: Secondary | ICD-10-CM | POA: Diagnosis not present

## 2020-07-19 DIAGNOSIS — M47812 Spondylosis without myelopathy or radiculopathy, cervical region: Secondary | ICD-10-CM | POA: Diagnosis not present

## 2020-07-19 DIAGNOSIS — M438X2 Other specified deforming dorsopathies, cervical region: Secondary | ICD-10-CM | POA: Diagnosis not present

## 2020-07-19 DIAGNOSIS — M545 Low back pain, unspecified: Secondary | ICD-10-CM | POA: Diagnosis not present

## 2020-07-19 DIAGNOSIS — M5441 Lumbago with sciatica, right side: Secondary | ICD-10-CM | POA: Diagnosis not present

## 2020-07-19 DIAGNOSIS — G8929 Other chronic pain: Secondary | ICD-10-CM | POA: Diagnosis not present

## 2020-07-19 DIAGNOSIS — M5442 Lumbago with sciatica, left side: Secondary | ICD-10-CM | POA: Diagnosis not present

## 2020-07-19 DIAGNOSIS — M542 Cervicalgia: Secondary | ICD-10-CM | POA: Diagnosis not present

## 2020-07-29 DIAGNOSIS — M5441 Lumbago with sciatica, right side: Secondary | ICD-10-CM | POA: Diagnosis not present

## 2020-07-29 DIAGNOSIS — M438X2 Other specified deforming dorsopathies, cervical region: Secondary | ICD-10-CM | POA: Diagnosis not present

## 2020-07-29 DIAGNOSIS — M5442 Lumbago with sciatica, left side: Secondary | ICD-10-CM | POA: Diagnosis not present

## 2020-07-29 DIAGNOSIS — M47812 Spondylosis without myelopathy or radiculopathy, cervical region: Secondary | ICD-10-CM | POA: Diagnosis not present

## 2020-07-29 DIAGNOSIS — M545 Low back pain, unspecified: Secondary | ICD-10-CM | POA: Diagnosis not present

## 2020-07-29 DIAGNOSIS — G8929 Other chronic pain: Secondary | ICD-10-CM | POA: Diagnosis not present

## 2020-07-29 DIAGNOSIS — M542 Cervicalgia: Secondary | ICD-10-CM | POA: Diagnosis not present

## 2020-08-02 DIAGNOSIS — M438X2 Other specified deforming dorsopathies, cervical region: Secondary | ICD-10-CM | POA: Diagnosis not present

## 2020-08-02 DIAGNOSIS — M542 Cervicalgia: Secondary | ICD-10-CM | POA: Diagnosis not present

## 2020-08-02 DIAGNOSIS — G8929 Other chronic pain: Secondary | ICD-10-CM | POA: Diagnosis not present

## 2020-08-02 DIAGNOSIS — M5441 Lumbago with sciatica, right side: Secondary | ICD-10-CM | POA: Diagnosis not present

## 2020-08-02 DIAGNOSIS — M47812 Spondylosis without myelopathy or radiculopathy, cervical region: Secondary | ICD-10-CM | POA: Diagnosis not present

## 2020-08-02 DIAGNOSIS — M5442 Lumbago with sciatica, left side: Secondary | ICD-10-CM | POA: Diagnosis not present

## 2020-08-02 DIAGNOSIS — M545 Low back pain, unspecified: Secondary | ICD-10-CM | POA: Diagnosis not present

## 2020-08-05 DIAGNOSIS — M545 Low back pain, unspecified: Secondary | ICD-10-CM | POA: Diagnosis not present

## 2020-08-05 DIAGNOSIS — M438X2 Other specified deforming dorsopathies, cervical region: Secondary | ICD-10-CM | POA: Diagnosis not present

## 2020-08-05 DIAGNOSIS — M47812 Spondylosis without myelopathy or radiculopathy, cervical region: Secondary | ICD-10-CM | POA: Diagnosis not present

## 2020-08-05 DIAGNOSIS — G8929 Other chronic pain: Secondary | ICD-10-CM | POA: Diagnosis not present

## 2020-08-05 DIAGNOSIS — M542 Cervicalgia: Secondary | ICD-10-CM | POA: Diagnosis not present

## 2020-08-05 DIAGNOSIS — M5442 Lumbago with sciatica, left side: Secondary | ICD-10-CM | POA: Diagnosis not present

## 2020-08-05 DIAGNOSIS — M5441 Lumbago with sciatica, right side: Secondary | ICD-10-CM | POA: Diagnosis not present

## 2020-08-07 DIAGNOSIS — I10 Essential (primary) hypertension: Secondary | ICD-10-CM | POA: Diagnosis not present

## 2020-08-07 DIAGNOSIS — K219 Gastro-esophageal reflux disease without esophagitis: Secondary | ICD-10-CM | POA: Diagnosis not present

## 2020-08-10 DIAGNOSIS — M542 Cervicalgia: Secondary | ICD-10-CM | POA: Diagnosis not present

## 2020-08-10 DIAGNOSIS — M47812 Spondylosis without myelopathy or radiculopathy, cervical region: Secondary | ICD-10-CM | POA: Diagnosis not present

## 2020-08-10 DIAGNOSIS — M545 Low back pain, unspecified: Secondary | ICD-10-CM | POA: Diagnosis not present

## 2020-08-10 DIAGNOSIS — G8929 Other chronic pain: Secondary | ICD-10-CM | POA: Diagnosis not present

## 2020-08-10 DIAGNOSIS — M438X2 Other specified deforming dorsopathies, cervical region: Secondary | ICD-10-CM | POA: Diagnosis not present

## 2020-08-10 DIAGNOSIS — M5442 Lumbago with sciatica, left side: Secondary | ICD-10-CM | POA: Diagnosis not present

## 2020-08-10 DIAGNOSIS — M5441 Lumbago with sciatica, right side: Secondary | ICD-10-CM | POA: Diagnosis not present

## 2020-08-11 DIAGNOSIS — M8580 Other specified disorders of bone density and structure, unspecified site: Secondary | ICD-10-CM | POA: Diagnosis not present

## 2020-08-11 DIAGNOSIS — F4321 Adjustment disorder with depressed mood: Secondary | ICD-10-CM | POA: Diagnosis not present

## 2020-08-11 DIAGNOSIS — E785 Hyperlipidemia, unspecified: Secondary | ICD-10-CM | POA: Diagnosis not present

## 2020-08-11 DIAGNOSIS — R7302 Impaired glucose tolerance (oral): Secondary | ICD-10-CM | POA: Diagnosis not present

## 2020-08-11 DIAGNOSIS — Z634 Disappearance and death of family member: Secondary | ICD-10-CM | POA: Diagnosis not present

## 2020-08-11 DIAGNOSIS — Z681 Body mass index (BMI) 19 or less, adult: Secondary | ICD-10-CM | POA: Diagnosis not present

## 2020-08-11 DIAGNOSIS — I10 Essential (primary) hypertension: Secondary | ICD-10-CM | POA: Diagnosis not present

## 2020-08-11 DIAGNOSIS — R0989 Other specified symptoms and signs involving the circulatory and respiratory systems: Secondary | ICD-10-CM | POA: Diagnosis not present

## 2020-08-11 DIAGNOSIS — Z79899 Other long term (current) drug therapy: Secondary | ICD-10-CM | POA: Diagnosis not present

## 2020-08-11 DIAGNOSIS — M4722 Other spondylosis with radiculopathy, cervical region: Secondary | ICD-10-CM | POA: Diagnosis not present

## 2020-08-11 DIAGNOSIS — Z0000 Encounter for general adult medical examination without abnormal findings: Secondary | ICD-10-CM | POA: Diagnosis not present

## 2020-08-11 DIAGNOSIS — K2960 Other gastritis without bleeding: Secondary | ICD-10-CM | POA: Diagnosis not present

## 2020-08-13 DIAGNOSIS — M47812 Spondylosis without myelopathy or radiculopathy, cervical region: Secondary | ICD-10-CM | POA: Diagnosis not present

## 2020-08-13 DIAGNOSIS — M5442 Lumbago with sciatica, left side: Secondary | ICD-10-CM | POA: Diagnosis not present

## 2020-08-13 DIAGNOSIS — M545 Low back pain, unspecified: Secondary | ICD-10-CM | POA: Diagnosis not present

## 2020-08-13 DIAGNOSIS — G8929 Other chronic pain: Secondary | ICD-10-CM | POA: Diagnosis not present

## 2020-08-13 DIAGNOSIS — M542 Cervicalgia: Secondary | ICD-10-CM | POA: Diagnosis not present

## 2020-08-13 DIAGNOSIS — M438X2 Other specified deforming dorsopathies, cervical region: Secondary | ICD-10-CM | POA: Diagnosis not present

## 2020-08-13 DIAGNOSIS — M5441 Lumbago with sciatica, right side: Secondary | ICD-10-CM | POA: Diagnosis not present

## 2020-08-17 DIAGNOSIS — M5442 Lumbago with sciatica, left side: Secondary | ICD-10-CM | POA: Diagnosis not present

## 2020-08-17 DIAGNOSIS — M542 Cervicalgia: Secondary | ICD-10-CM | POA: Diagnosis not present

## 2020-08-17 DIAGNOSIS — G8929 Other chronic pain: Secondary | ICD-10-CM | POA: Diagnosis not present

## 2020-08-17 DIAGNOSIS — M5441 Lumbago with sciatica, right side: Secondary | ICD-10-CM | POA: Diagnosis not present

## 2020-08-17 DIAGNOSIS — M545 Low back pain, unspecified: Secondary | ICD-10-CM | POA: Diagnosis not present

## 2020-08-17 DIAGNOSIS — M47812 Spondylosis without myelopathy or radiculopathy, cervical region: Secondary | ICD-10-CM | POA: Diagnosis not present

## 2020-08-17 DIAGNOSIS — M438X2 Other specified deforming dorsopathies, cervical region: Secondary | ICD-10-CM | POA: Diagnosis not present

## 2020-08-20 DIAGNOSIS — M5442 Lumbago with sciatica, left side: Secondary | ICD-10-CM | POA: Diagnosis not present

## 2020-08-20 DIAGNOSIS — M5441 Lumbago with sciatica, right side: Secondary | ICD-10-CM | POA: Diagnosis not present

## 2020-08-20 DIAGNOSIS — M542 Cervicalgia: Secondary | ICD-10-CM | POA: Diagnosis not present

## 2020-08-20 DIAGNOSIS — M47812 Spondylosis without myelopathy or radiculopathy, cervical region: Secondary | ICD-10-CM | POA: Diagnosis not present

## 2020-08-20 DIAGNOSIS — M545 Low back pain, unspecified: Secondary | ICD-10-CM | POA: Diagnosis not present

## 2020-08-20 DIAGNOSIS — G8929 Other chronic pain: Secondary | ICD-10-CM | POA: Diagnosis not present

## 2020-08-20 DIAGNOSIS — M438X2 Other specified deforming dorsopathies, cervical region: Secondary | ICD-10-CM | POA: Diagnosis not present

## 2020-08-26 DIAGNOSIS — M438X2 Other specified deforming dorsopathies, cervical region: Secondary | ICD-10-CM | POA: Diagnosis not present

## 2020-08-26 DIAGNOSIS — M5442 Lumbago with sciatica, left side: Secondary | ICD-10-CM | POA: Diagnosis not present

## 2020-08-26 DIAGNOSIS — M542 Cervicalgia: Secondary | ICD-10-CM | POA: Diagnosis not present

## 2020-08-26 DIAGNOSIS — M47812 Spondylosis without myelopathy or radiculopathy, cervical region: Secondary | ICD-10-CM | POA: Diagnosis not present

## 2020-08-26 DIAGNOSIS — G8929 Other chronic pain: Secondary | ICD-10-CM | POA: Diagnosis not present

## 2020-08-26 DIAGNOSIS — M545 Low back pain, unspecified: Secondary | ICD-10-CM | POA: Diagnosis not present

## 2020-08-26 DIAGNOSIS — M5441 Lumbago with sciatica, right side: Secondary | ICD-10-CM | POA: Diagnosis not present

## 2020-08-30 DIAGNOSIS — M545 Low back pain, unspecified: Secondary | ICD-10-CM | POA: Diagnosis not present

## 2020-08-30 DIAGNOSIS — M47812 Spondylosis without myelopathy or radiculopathy, cervical region: Secondary | ICD-10-CM | POA: Diagnosis not present

## 2020-08-30 DIAGNOSIS — M5442 Lumbago with sciatica, left side: Secondary | ICD-10-CM | POA: Diagnosis not present

## 2020-08-30 DIAGNOSIS — M542 Cervicalgia: Secondary | ICD-10-CM | POA: Diagnosis not present

## 2020-08-30 DIAGNOSIS — M5441 Lumbago with sciatica, right side: Secondary | ICD-10-CM | POA: Diagnosis not present

## 2020-08-30 DIAGNOSIS — M438X2 Other specified deforming dorsopathies, cervical region: Secondary | ICD-10-CM | POA: Diagnosis not present

## 2020-08-30 DIAGNOSIS — G8929 Other chronic pain: Secondary | ICD-10-CM | POA: Diagnosis not present

## 2020-09-06 DIAGNOSIS — M5442 Lumbago with sciatica, left side: Secondary | ICD-10-CM | POA: Diagnosis not present

## 2020-09-06 DIAGNOSIS — M5441 Lumbago with sciatica, right side: Secondary | ICD-10-CM | POA: Diagnosis not present

## 2020-09-06 DIAGNOSIS — M542 Cervicalgia: Secondary | ICD-10-CM | POA: Diagnosis not present

## 2020-09-06 DIAGNOSIS — M545 Low back pain, unspecified: Secondary | ICD-10-CM | POA: Diagnosis not present

## 2020-09-06 DIAGNOSIS — M438X2 Other specified deforming dorsopathies, cervical region: Secondary | ICD-10-CM | POA: Diagnosis not present

## 2020-09-06 DIAGNOSIS — G8929 Other chronic pain: Secondary | ICD-10-CM | POA: Diagnosis not present

## 2020-09-06 DIAGNOSIS — M47812 Spondylosis without myelopathy or radiculopathy, cervical region: Secondary | ICD-10-CM | POA: Diagnosis not present

## 2020-09-07 DIAGNOSIS — I10 Essential (primary) hypertension: Secondary | ICD-10-CM | POA: Diagnosis not present

## 2020-09-07 DIAGNOSIS — K219 Gastro-esophageal reflux disease without esophagitis: Secondary | ICD-10-CM | POA: Diagnosis not present

## 2020-09-09 DIAGNOSIS — M545 Low back pain, unspecified: Secondary | ICD-10-CM | POA: Diagnosis not present

## 2020-09-09 DIAGNOSIS — M438X2 Other specified deforming dorsopathies, cervical region: Secondary | ICD-10-CM | POA: Diagnosis not present

## 2020-09-09 DIAGNOSIS — M542 Cervicalgia: Secondary | ICD-10-CM | POA: Diagnosis not present

## 2020-09-09 DIAGNOSIS — M5441 Lumbago with sciatica, right side: Secondary | ICD-10-CM | POA: Diagnosis not present

## 2020-09-09 DIAGNOSIS — M47812 Spondylosis without myelopathy or radiculopathy, cervical region: Secondary | ICD-10-CM | POA: Diagnosis not present

## 2020-09-09 DIAGNOSIS — M5442 Lumbago with sciatica, left side: Secondary | ICD-10-CM | POA: Diagnosis not present

## 2020-09-09 DIAGNOSIS — G8929 Other chronic pain: Secondary | ICD-10-CM | POA: Diagnosis not present

## 2020-12-06 DIAGNOSIS — K219 Gastro-esophageal reflux disease without esophagitis: Secondary | ICD-10-CM | POA: Diagnosis not present

## 2020-12-06 DIAGNOSIS — I10 Essential (primary) hypertension: Secondary | ICD-10-CM | POA: Diagnosis not present

## 2021-01-05 DIAGNOSIS — K219 Gastro-esophageal reflux disease without esophagitis: Secondary | ICD-10-CM | POA: Diagnosis not present

## 2021-01-05 DIAGNOSIS — I10 Essential (primary) hypertension: Secondary | ICD-10-CM | POA: Diagnosis not present

## 2021-02-05 DIAGNOSIS — I10 Essential (primary) hypertension: Secondary | ICD-10-CM | POA: Diagnosis not present

## 2021-02-05 DIAGNOSIS — K219 Gastro-esophageal reflux disease without esophagitis: Secondary | ICD-10-CM | POA: Diagnosis not present

## 2021-03-07 DIAGNOSIS — I10 Essential (primary) hypertension: Secondary | ICD-10-CM | POA: Diagnosis not present

## 2021-03-07 DIAGNOSIS — K219 Gastro-esophageal reflux disease without esophagitis: Secondary | ICD-10-CM | POA: Diagnosis not present

## 2021-05-08 DIAGNOSIS — K219 Gastro-esophageal reflux disease without esophagitis: Secondary | ICD-10-CM | POA: Diagnosis not present

## 2021-05-08 DIAGNOSIS — I10 Essential (primary) hypertension: Secondary | ICD-10-CM | POA: Diagnosis not present

## 2021-07-08 DIAGNOSIS — E7800 Pure hypercholesterolemia, unspecified: Secondary | ICD-10-CM | POA: Diagnosis not present

## 2021-07-08 DIAGNOSIS — I10 Essential (primary) hypertension: Secondary | ICD-10-CM | POA: Diagnosis not present

## 2021-08-08 DIAGNOSIS — K219 Gastro-esophageal reflux disease without esophagitis: Secondary | ICD-10-CM | POA: Diagnosis not present

## 2021-08-08 DIAGNOSIS — I10 Essential (primary) hypertension: Secondary | ICD-10-CM | POA: Diagnosis not present

## 2021-08-24 DIAGNOSIS — R7302 Impaired glucose tolerance (oral): Secondary | ICD-10-CM | POA: Diagnosis not present

## 2021-08-24 DIAGNOSIS — Z0000 Encounter for general adult medical examination without abnormal findings: Secondary | ICD-10-CM | POA: Diagnosis not present

## 2021-08-24 DIAGNOSIS — K2960 Other gastritis without bleeding: Secondary | ICD-10-CM | POA: Diagnosis not present

## 2021-08-24 DIAGNOSIS — F17210 Nicotine dependence, cigarettes, uncomplicated: Secondary | ICD-10-CM | POA: Diagnosis not present

## 2021-08-24 DIAGNOSIS — Z2821 Immunization not carried out because of patient refusal: Secondary | ICD-10-CM | POA: Diagnosis not present

## 2021-08-24 DIAGNOSIS — Z9181 History of falling: Secondary | ICD-10-CM | POA: Diagnosis not present

## 2021-08-24 DIAGNOSIS — Z1331 Encounter for screening for depression: Secondary | ICD-10-CM | POA: Diagnosis not present

## 2021-08-24 DIAGNOSIS — E785 Hyperlipidemia, unspecified: Secondary | ICD-10-CM | POA: Diagnosis not present

## 2021-08-24 DIAGNOSIS — M810 Age-related osteoporosis without current pathological fracture: Secondary | ICD-10-CM | POA: Diagnosis not present

## 2021-08-24 DIAGNOSIS — M4722 Other spondylosis with radiculopathy, cervical region: Secondary | ICD-10-CM | POA: Diagnosis not present

## 2021-08-24 DIAGNOSIS — I10 Essential (primary) hypertension: Secondary | ICD-10-CM | POA: Diagnosis not present

## 2021-08-24 DIAGNOSIS — Z79899 Other long term (current) drug therapy: Secondary | ICD-10-CM | POA: Diagnosis not present

## 2022-10-16 DIAGNOSIS — H2512 Age-related nuclear cataract, left eye: Secondary | ICD-10-CM | POA: Diagnosis not present

## 2022-10-16 DIAGNOSIS — H269 Unspecified cataract: Secondary | ICD-10-CM | POA: Diagnosis not present

## 2022-10-30 DIAGNOSIS — H269 Unspecified cataract: Secondary | ICD-10-CM | POA: Diagnosis not present

## 2022-10-30 DIAGNOSIS — H2511 Age-related nuclear cataract, right eye: Secondary | ICD-10-CM | POA: Diagnosis not present

## 2022-11-02 ENCOUNTER — Emergency Department (HOSPITAL_COMMUNITY)
Admission: EM | Admit: 2022-11-02 | Discharge: 2022-11-02 | Disposition: A | Discharge status: Home / Self Care | Payer: PPO | Attending: Emergency Medicine | Admitting: Emergency Medicine

## 2022-11-02 ENCOUNTER — Encounter (HOSPITAL_COMMUNITY): Payer: Self-pay

## 2022-11-02 DIAGNOSIS — H538 Other visual disturbances: Secondary | ICD-10-CM | POA: Insufficient documentation

## 2022-11-02 DIAGNOSIS — H539 Unspecified visual disturbance: Secondary | ICD-10-CM | POA: Diagnosis not present

## 2022-11-02 NOTE — ED Provider Notes (Signed)
[facility] Provider Note   CSN: [id number] Arrival date & time: 11/02/22  0325     History  Chief Complaint  Patient presents with   Eye Problem    Jody Webb is a 72 y.o. female.   Eye Problem    This is a 72 year old female presenting to the emergency department due to flashing lights.  Patient had cataract surgery to the right eye 4 days ago. This morning few hours prior to arrival she started having flashing lights unable to localize which I they are occurring in.  She is also seeing different colors of lights surrounding objects.  No pain, no diplopia or vision loss.  She has had history of cataract surgery in the left eye previously.  She denies any lateralized weakness, numbness, tingling, headaches, slurred speech, facial droop.  Home Medications Prior to Admission medications   Not on File      Allergies    Patient has no known allergies.    Review of Systems   Review of Systems  Physical Exam Updated Vital Signs BP (!) 155/88 (BP Location: Left Arm)   Pulse 97   Temp 98.4 F (36.9 C) (Oral)   Resp 16   SpO2 96%  Physical Exam Vitals and nursing note reviewed. Exam conducted with a chaperone present.  Constitutional:      Appearance: Normal appearance.  HENT:     Head: Normocephalic and atraumatic.  Eyes:     General: No scleral icterus.       Right eye: No discharge.        Left eye: No discharge.     Extraocular Movements: Extraocular movements intact.     Pupils: Pupils are equal, round, and reactive to light.     Comments: EOMI, pupils are PERRLA with intact pupillary afferent reflex.  No nystagmus  Cardiovascular:     Rate and Rhythm: Normal rate and regular rhythm.     Pulses: Normal pulses.     Heart sounds: Normal heart sounds. No murmur heard.    No friction rub. No gallop.  Pulmonary:     Effort: Pulmonary effort is normal. No respiratory distress.     Breath sounds: Normal breath sounds.   Abdominal:     General: Abdomen is flat. Bowel sounds are normal. There is no distension.     Palpations: Abdomen is soft.     Tenderness: There is no abdominal tenderness.  Skin:    General: Skin is warm and dry.     Coloration: Skin is not jaundiced.  Neurological:     Mental Status: She is alert. Mental status is at baseline.     Coordination: Coordination normal.     Comments: Cranial nerves II through XII grossly intact.  Upper and lower extremity strength is symmetric bilaterally.  Ambulatory with a steady gait.     ED Results / Procedures / Treatments   Labs (all labs ordered are listed, but only abnormal results are displayed) Labs Reviewed - No data to display  EKG None  Radiology No results found.  Procedures Procedures    Medications Ordered in ED Medications - No data to display  ED Course/ Medical Decision Making/ A&P                             Medical Decision Making  This is a 72 year old female presenting to the emergency department due to flashing lights  and visual changes.  Given most recent surgery concern for postop complication such as retinal detachment.  I considered stroke/TIA but she has no lateralized weakness numbness, neuroexam is unremarkable and symptoms seem very well localized to her vision.  Do not feel this is consistent with TIA or CVA.  I discussed case with Dr.Leonard on-call with ophthalmology.  They state if not having any TIA or strokelike symptoms patient is appropriate for really close outpatient follow-up with them.  They will see her in the office in 4 hours from now when they open.  I discussed return precautions with the patient and daughter including lateralized weakness, severe headache, tingling, facial droop, dysarthria.  Stable for discharge at this time.        Final Clinical Impression(s) / ED Diagnoses Final diagnoses:  Vision changes    Rx / DC Orders ED Discharge Orders     None         Sherrill Raring,  Hershal Coria 11/02/22 Miquel Dunn, MD 11/02/22 [phone]

## 2022-11-02 NOTE — ED Notes (Signed)
Right eye is 20/25 and left eye is 20/40

## 2022-11-02 NOTE — Discharge Instructions (Signed)
Please see the ophthalmologist first thing tomorrow morning.  They open at 8 AM, you can either go to the office right when they open or call ahead but they will see you in the morning and work you into their schedule.

## 2022-11-02 NOTE — ED Triage Notes (Signed)
Pt recently had cataract surgery over the past two week on both eyes and tonight began to see flashing in her eyes but can not tell which eye it is coming from.

## 2022-11-15 DIAGNOSIS — H35371 Puckering of macula, right eye: Secondary | ICD-10-CM | POA: Diagnosis not present

## 2022-11-15 DIAGNOSIS — H43821 Vitreomacular adhesion, right eye: Secondary | ICD-10-CM | POA: Diagnosis not present

## 2022-11-15 DIAGNOSIS — H43812 Vitreous degeneration, left eye: Secondary | ICD-10-CM | POA: Diagnosis not present

## 2023-06-07 DIAGNOSIS — H35371 Puckering of macula, right eye: Secondary | ICD-10-CM | POA: Diagnosis not present

## 2023-08-21 DIAGNOSIS — H35371 Puckering of macula, right eye: Secondary | ICD-10-CM | POA: Diagnosis not present

## 2023-08-30 DIAGNOSIS — H35371 Puckering of macula, right eye: Secondary | ICD-10-CM | POA: Diagnosis not present

## 2023-10-08 DIAGNOSIS — Z79899 Other long term (current) drug therapy: Secondary | ICD-10-CM | POA: Diagnosis not present

## 2023-10-08 DIAGNOSIS — K2960 Other gastritis without bleeding: Secondary | ICD-10-CM | POA: Diagnosis not present

## 2023-10-08 DIAGNOSIS — I10 Essential (primary) hypertension: Secondary | ICD-10-CM | POA: Diagnosis not present

## 2023-10-08 DIAGNOSIS — M4722 Other spondylosis with radiculopathy, cervical region: Secondary | ICD-10-CM | POA: Diagnosis not present

## 2023-10-08 DIAGNOSIS — Z9181 History of falling: Secondary | ICD-10-CM | POA: Diagnosis not present

## 2023-10-08 DIAGNOSIS — Z0000 Encounter for general adult medical examination without abnormal findings: Secondary | ICD-10-CM | POA: Diagnosis not present

## 2023-10-08 DIAGNOSIS — E785 Hyperlipidemia, unspecified: Secondary | ICD-10-CM | POA: Diagnosis not present

## 2023-10-08 DIAGNOSIS — F17210 Nicotine dependence, cigarettes, uncomplicated: Secondary | ICD-10-CM | POA: Diagnosis not present

## 2023-10-08 DIAGNOSIS — R7302 Impaired glucose tolerance (oral): Secondary | ICD-10-CM | POA: Diagnosis not present

## 2023-10-08 DIAGNOSIS — M810 Age-related osteoporosis without current pathological fracture: Secondary | ICD-10-CM | POA: Diagnosis not present

## 2023-10-08 DIAGNOSIS — Z1339 Encounter for screening examination for other mental health and behavioral disorders: Secondary | ICD-10-CM | POA: Diagnosis not present

## 2023-10-08 DIAGNOSIS — Z6820 Body mass index (BMI) 20.0-20.9, adult: Secondary | ICD-10-CM | POA: Diagnosis not present

## 2023-10-11 DIAGNOSIS — H35371 Puckering of macula, right eye: Secondary | ICD-10-CM | POA: Diagnosis not present

## 2023-10-17 DIAGNOSIS — Z6820 Body mass index (BMI) 20.0-20.9, adult: Secondary | ICD-10-CM | POA: Diagnosis not present

## 2023-10-17 DIAGNOSIS — M5440 Lumbago with sciatica, unspecified side: Secondary | ICD-10-CM | POA: Diagnosis not present

## 2023-12-06 DIAGNOSIS — H43821 Vitreomacular adhesion, right eye: Secondary | ICD-10-CM | POA: Diagnosis not present

## 2024-01-03 DIAGNOSIS — H524 Presbyopia: Secondary | ICD-10-CM | POA: Diagnosis not present

## 2024-07-07 DIAGNOSIS — H353132 Nonexudative age-related macular degeneration, bilateral, intermediate dry stage: Secondary | ICD-10-CM | POA: Diagnosis not present

## 2024-07-25 DIAGNOSIS — H26491 Other secondary cataract, right eye: Secondary | ICD-10-CM | POA: Diagnosis not present
